# Patient Record
Sex: Female | Born: 1960 | Race: Black or African American | Marital: Married | Smoking: Never smoker
Health system: Southern US, Community
[De-identification: ages and names within clinical notes are randomized; demographics above are authoritative.]

## PROBLEM LIST (undated history)

## (undated) DIAGNOSIS — B019 Varicella without complication: Secondary | ICD-10-CM

## (undated) DIAGNOSIS — R32 Unspecified urinary incontinence: Secondary | ICD-10-CM

## (undated) HISTORY — DX: Unspecified urinary incontinence: R32

## (undated) HISTORY — DX: Varicella without complication: B01.9

---

## 2004-08-11 ENCOUNTER — Ambulatory Visit: Payer: Self-pay | Admitting: General Practice

## 2011-02-01 ENCOUNTER — Ambulatory Visit: Payer: Self-pay | Admitting: Internal Medicine

## 2011-04-15 ENCOUNTER — Ambulatory Visit: Payer: Self-pay | Admitting: Emergency Medicine

## 2011-04-15 LAB — PREGNANCY, URINE: Pregnancy Test, Urine: NEGATIVE m[IU]/mL

## 2011-04-19 LAB — PATHOLOGY REPORT

## 2012-02-29 HISTORY — PX: BREAST BIOPSY: SHX20

## 2012-06-12 ENCOUNTER — Ambulatory Visit: Payer: Self-pay | Admitting: Family Medicine

## 2013-01-29 IMAGING — US ULTRASOUND RIGHT BREAST
1 series · 15 of 15 positions shown · non-contrast
Comparison: none

REASON FOR EXAM: RT NIPPLE DISCHARGE
COMMENTS:

[Series 1: ultrasound right breast · 15 of 15 slices shown]
[im 1/15]
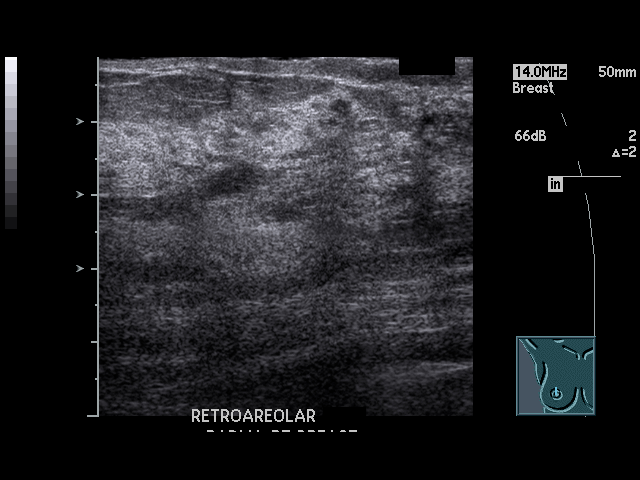
[im 2/15]
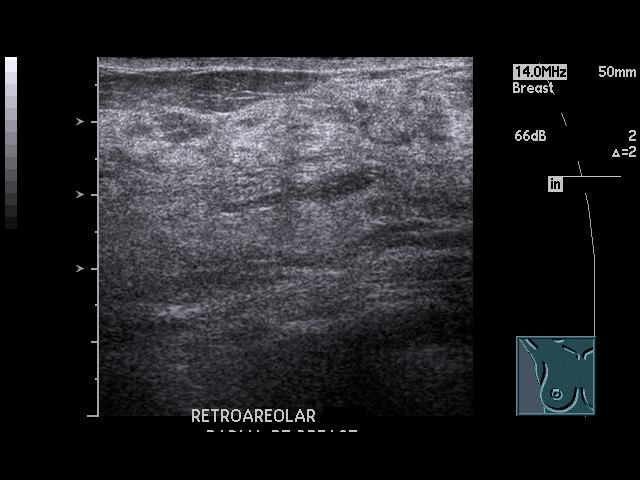
[im 3/15]
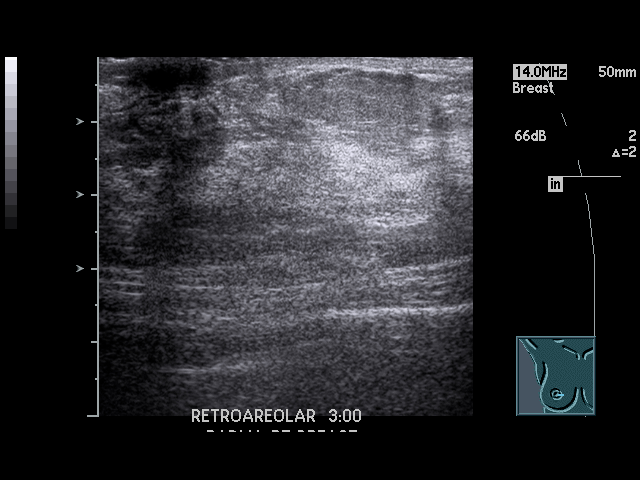
[im 4/15]
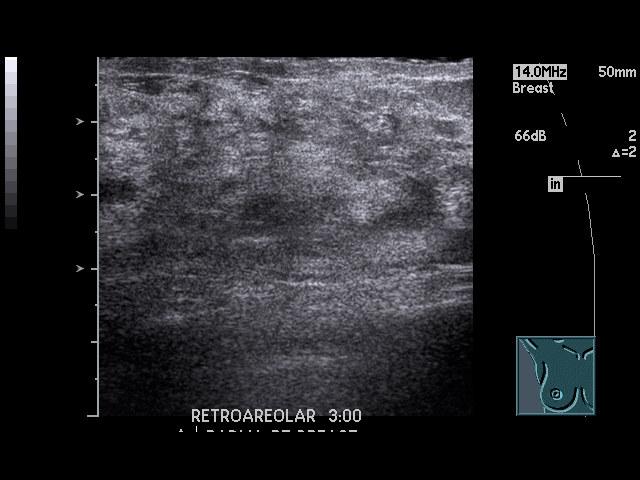
[im 5/15]
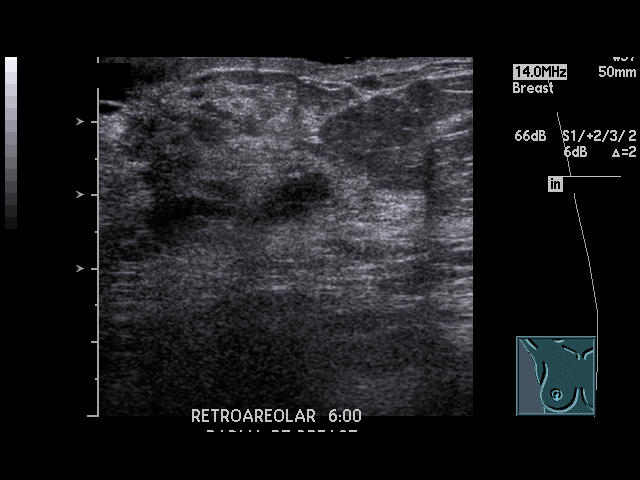
[im 6/15]
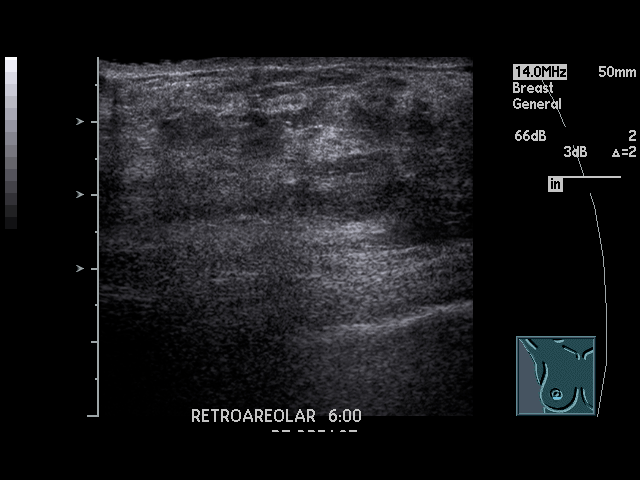
[im 7/15]
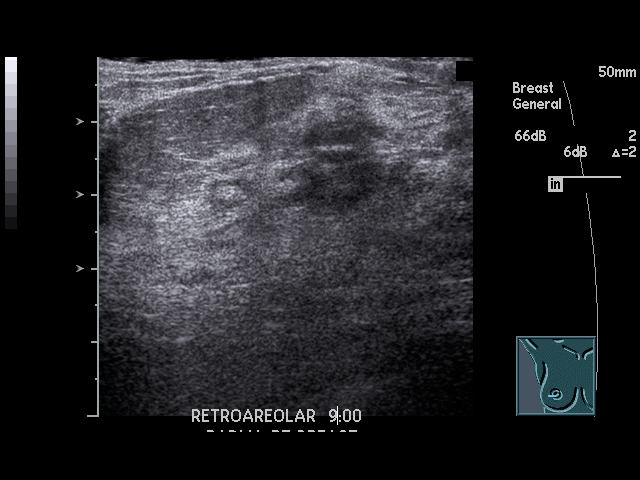
[im 8/15]
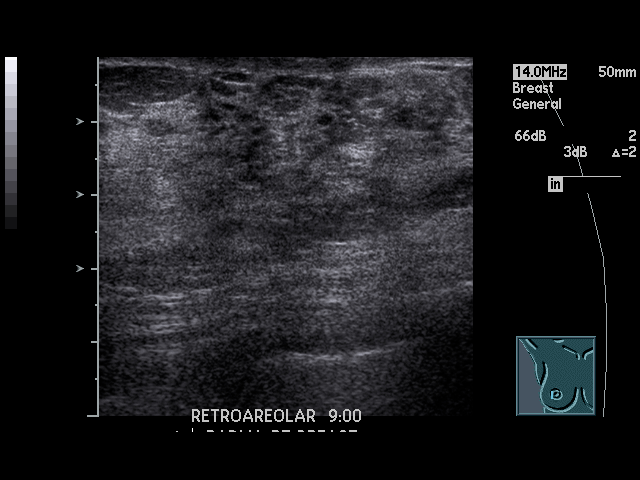
[im 9/15]
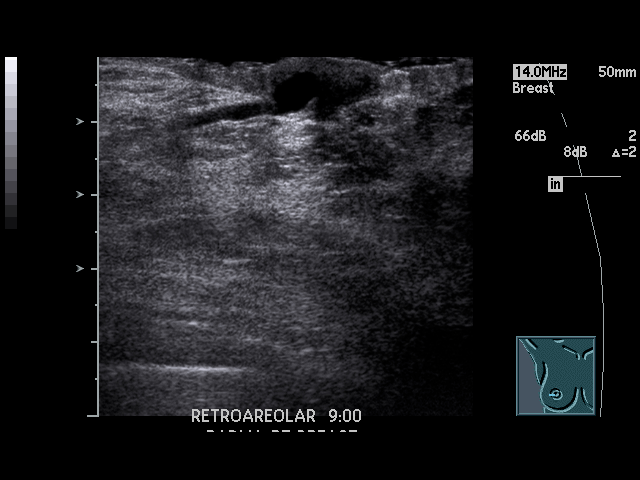
[im 10/15]
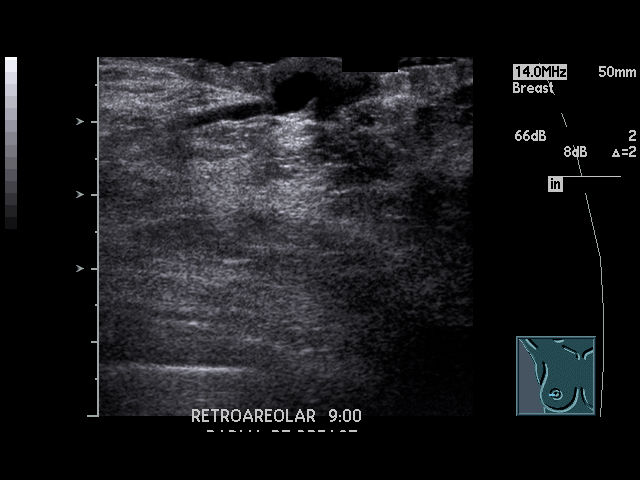
[im 11/15]
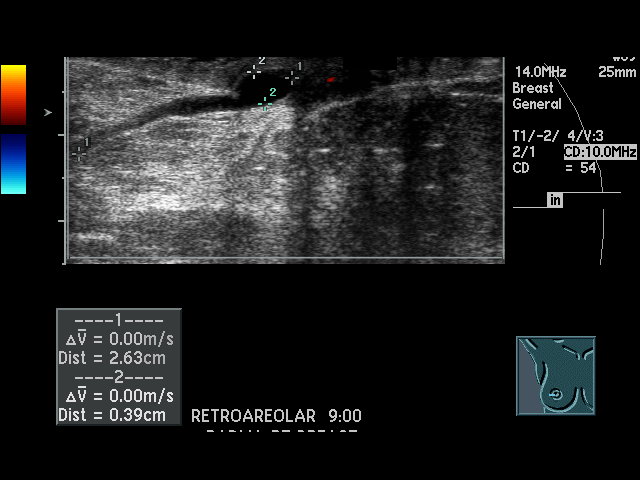
[im 12/15]
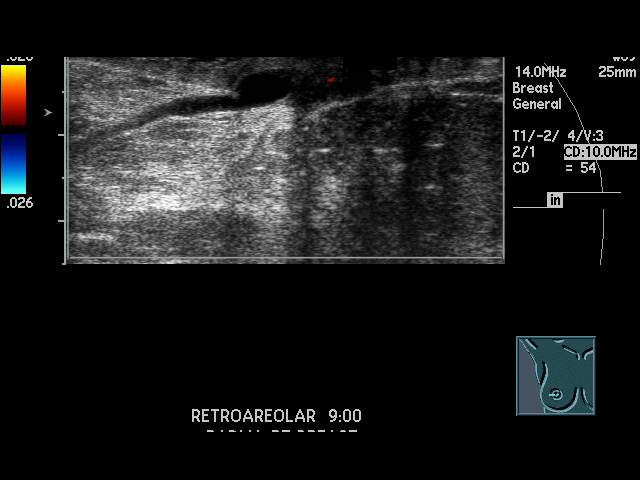
[im 13/15]
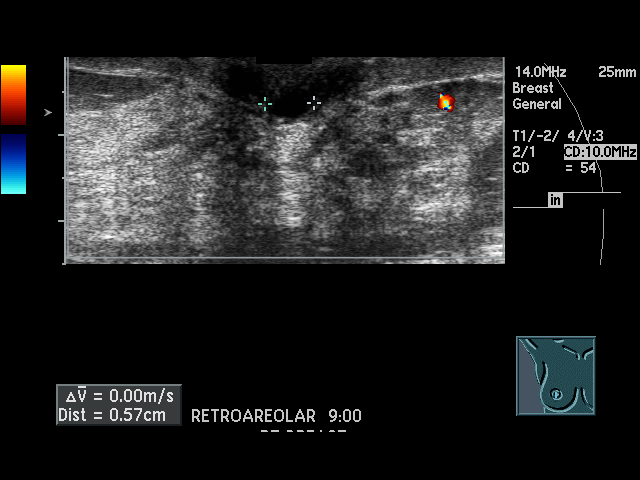
[im 14/15]
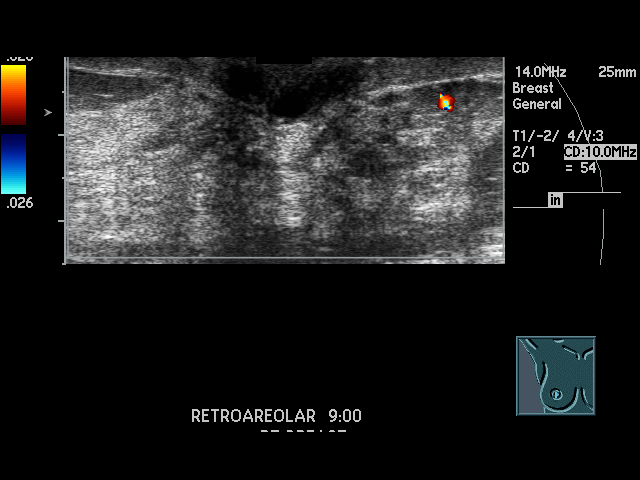
[im 15/15]
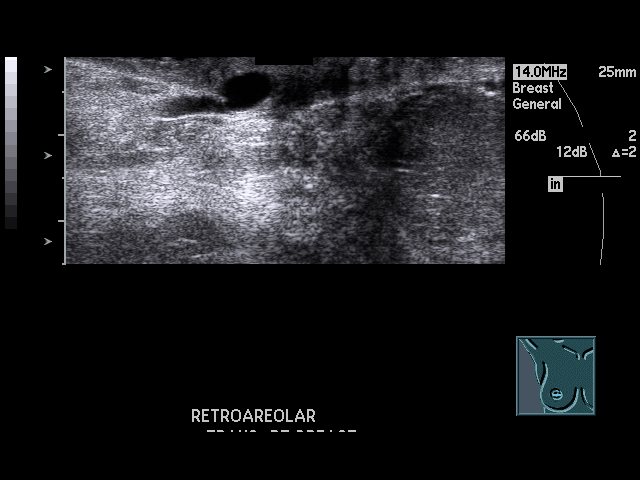

[15 of 15 positions shown; findings below may reference images not displayed]

PROCEDURE:     US  - US BREAST RIGHT  - February 01, 2011  [DATE]

RESULT:     The patient is reporting nipple discharge.

The retroareolar region of the right breast was evaluated. At the 9 o'clock
position, there is a dilated duct without intraluminal echoes. This duct
measures 2.6 x 0.4 x 0.6 cm. Otherwise, the periareolar breast parenchyma
appears normal.
IMPRESSION: There is a dilated duct at the 9 o'clock position on the
right. No intraluminal echoes are demonstrated. Please see the dictation of
the diagnostic mammogram of this same day for final recommendations and
BI-RADS classification.

## 2013-07-08 ENCOUNTER — Ambulatory Visit: Payer: Self-pay | Admitting: Family Medicine

## 2014-06-18 ENCOUNTER — Ambulatory Visit
Admit: 2014-06-18 | Disposition: A | Discharge status: Home / Self Care | Payer: Self-pay | Attending: Family Medicine | Admitting: Family Medicine

## 2014-06-22 NOTE — Op Note (Signed)
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  OPERATION: Right breast biopsy with removal of ductal system from underneath nipple with a lot of dilated ducts.   SURGEON: Brydan Downard S. Vinie Charity, MD    INDICATION FOR SURGERY: This patient was seen by me in the office because of a lot of drainage from the nipple. She says once in a while it gets bloody also. She had an ultrasound performed that a lot of dilated ducts but one was seen at about the 9 o'clock position. I talked to her that we would remove these ducts and will need to make sure there is no ductal papilloma. I told her this is a problem which people keep on having drainage even after removal and I also told her the complications of the surgery, namely nipple retraction, bleeding, and nipple atrophy due to blood supply and loss of sensation. She understood very well and she is so sick of this drain that she wants to have this taken care of.   DESCRIPTION OF PROCEDURE: The patient was then brought to surgery. Under general anesthesia, the right breast was then prepped and draped. I squeezed the nipple on the right side at the 9 o'clock position and a lot of fluid came out. An incision was made over the areola and elliptical incision was made after making a skin flap. I dissected the nipple off the ductal system. The patient was found to have multiple ducts coming into this and when we transected them a lot of milky drainage was present. The nipple was completely taken off from all the ducts. There were multiple ducts. All of those were then grasped with Allis clamps and dissection was done all around. Part of the breast tissue was then removed with these ducts and then under the nipple there  were a few areas which were still oozing. I just fulgurated them and nipple circulation looked very good. After that we stopped the bleeding from the breast side and irrigated out real good and closed it so it didn't look deformed. The nipple was then put back on the breast tissue and then subcuticular suturing was then performed and glue was applied on the skin.   The patient tolerated the procedure well and was sent to the recovery room in satisfactory condition.   ____________________________ Alton RevereMasud S. Cecelia ByarsHashmi, MD msh:drc D: 04/15/2011 09:13:18 ET T: 04/15/2011 11:12:45 ET JOB#: [id number]  cc: Cortney Mckinney S. Cecelia ByarsHashmi, MD, <Dictator> Meryle ReadyMASUD S Lemmie Vanlanen MD ELECTRONICALLY SIGNED 04/19/2011 13:13

## 2015-11-12 ENCOUNTER — Ambulatory Visit: Payer: Self-pay | Admitting: Family Medicine

## 2015-12-10 ENCOUNTER — Ambulatory Visit (INDEPENDENT_AMBULATORY_CARE_PROVIDER_SITE_OTHER): Payer: BLUE CROSS/BLUE SHIELD | Admitting: Family Medicine

## 2015-12-10 ENCOUNTER — Encounter: Payer: Self-pay | Admitting: Family Medicine

## 2015-12-10 DIAGNOSIS — Z0000 Encounter for general adult medical examination without abnormal findings: Secondary | ICD-10-CM | POA: Diagnosis not present

## 2015-12-10 NOTE — Assessment & Plan Note (Signed)
In need of Pap smear. Will return for this. Mammogram up-to-date. She will bring her results. Colonoscopy up-to-date. Defers HIV and hepatitis screening at this time. Flu shot up-to-date. Unsure of tetanus.

## 2015-12-10 NOTE — Progress Notes (Signed)
Subjective:  Patient ID: Kristen Sims, female    DOB: June 07, 1960  Age: 55 y.o. MRN: [id number]  CC: Establish care  HPI Kristen Sims is a 55 y.o. female presents to the clinic today to establish care. In need of physical exam.  Preventative Healthcare  Pap smear: In need of. Last pap was in 2010.  Mammogram: Reports she had this via work in April or May.   Colonoscopy: 5 years ago. Up to date.   Immunizations  Tetanus - Unsure.  Pneumococcal - N/A.  Flu - Up to date. Had 9/25.  Hepatitis C screening - Will wait.  Labs: Has had labs through work.   Exercise: No regular exercise.    Alcohol use: See below.  Smoking/tobacco use: Nonsmoker.  STD/HIV testing: Declines this time.  PMH, Surgical Hx, Family Hx, Social History reviewed and updated as below.  Past Medical History:  Diagnosis Date  . Chicken pox   . Urinary incontinence    Past Surgical History:  Procedure Laterality Date  . BREAST BIOPSY  2014  . CESAREAN SECTION Bilateral 1982   Family History  Problem Relation Age of Onset  . Diabetes Mother   . Lung cancer Father   . HIV Brother    Social History  Substance Use Topics  . Smoking status: Never Smoker  . Smokeless tobacco: Never Used  . Alcohol use 0.6 oz/week    1 Glasses of wine per week   Review of Systems  Cardiovascular: Positive for leg swelling.  Genitourinary:       Urinary incontinence. Sexual difficulty.  Musculoskeletal: Positive for arthralgias.  All other systems reviewed and are negative.   Objective:   Today's Vitals: BP 118/78 (BP Location: Left Arm, Patient Position: Sitting, Cuff Size: Normal)   Pulse 76   Temp 98.4 F (36.9 C) (Oral)   Resp 18   Ht 5\' 2"  (1.575 m)   Wt 233 lb 12 oz (106 kg)   LMP 02/28/2014 (Exact Date)   SpO2 96%   BMI 42.75 kg/m   Physical Exam  Constitutional: She is oriented to person, place, and time. She appears well-developed and well-nourished. No distress.  HENT:  Head:  Normocephalic and atraumatic.  Nose: Nose normal.  Mouth/Throat: Oropharynx is clear and moist. No oropharyngeal exudate.  Normal TM's bilaterally.   Eyes: Conjunctivae are normal. No scleral icterus.  Neck: Neck supple. No thyromegaly present.  Cardiovascular: Normal rate and regular rhythm.   No murmur heard. Pulmonary/Chest: Effort normal and breath sounds normal. She has no wheezes. She has no rales.  Abdominal: Soft. She exhibits no distension. There is no tenderness. There is no rebound and no guarding.  Musculoskeletal: Normal range of motion. She exhibits no edema.  Lymphadenopathy:    She has no cervical adenopathy.  Neurological: She is alert and oriented to person, place, and time.  Skin: Skin is warm and dry. No rash noted.  Psychiatric: She has a normal mood and affect.  Vitals reviewed.  Assessment & Plan:   Problem List Items Addressed This Visit    Annual physical exam    In need of Pap smear. Will return for this. Mammogram up-to-date. She will bring her results. Colonoscopy up-to-date. Defers HIV and hepatitis screening at this time. Flu shot up-to-date. Unsure of tetanus.       Other Visit Diagnoses   None.     Outpatient Encounter Prescriptions as of 12/10/2015  Medication Sig  . etodolac (LODINE) 500 MG tablet Take  500 mg by mouth 2 (two) times daily.  Marland Kitchen. oxybutynin (DITROPAN-XL) 10 MG 24 hr tablet Take 10 mg by mouth at bedtime.   No facility-administered encounter medications on file as of 12/10/2015.     Follow-up: Follow up at convenience for pap smear  Everlene OtherJayce Peace Noyes DO [facility] [place]

## 2015-12-10 NOTE — Patient Instructions (Signed)
Get your mammogram and blood work results for me.  Return at your convenience for a pap smear.  Follow up annually or sooner if needed.  Take care  Dr. Lacinda Axon   Health Maintenance, Female Adopting a healthy lifestyle and getting preventive care can go a long way to promote health and wellness. Talk with your health care provider about what schedule of regular examinations is right for you. This is a good chance for you to check in with your provider about disease prevention and staying healthy. In between checkups, there are plenty of things you can do on your own. Experts have done a lot of research about which lifestyle changes and preventive measures are most likely to keep you healthy. Ask your health care provider for more information. WEIGHT AND DIET  Eat a healthy diet  Be sure to include plenty of vegetables, fruits, low-fat dairy products, and lean protein.  Do not eat a lot of foods high in solid fats, added sugars, or salt.  Get regular exercise. This is one of the most important things you can do for your health.  Most adults should exercise for at least 150 minutes each week. The exercise should increase your heart rate and make you sweat (moderate-intensity exercise).  Most adults should also do strengthening exercises at least twice a week. This is in addition to the moderate-intensity exercise.  Maintain a healthy weight  Body mass index (BMI) is a measurement that can be used to identify possible weight problems. It estimates body fat based on height and weight. Your health care provider can help determine your BMI and help you achieve or maintain a healthy weight.  For females 48 years of age and older:   A BMI below 18.5 is considered underweight.  A BMI of 18.5 to 24.9 is normal.  A BMI of 25 to 29.9 is considered overweight.  A BMI of 30 and above is considered obese.  Watch levels of cholesterol and blood lipids  You should start having your blood tested  for lipids and cholesterol at 55 years of age, then have this test every 5 years.  You may need to have your cholesterol levels checked more often if:  Your lipid or cholesterol levels are high.  You are older than 55 years of age.  You are at high risk for heart disease.  CANCER SCREENING   Lung Cancer  Lung cancer screening is recommended for adults 38-21 years old who are at high risk for lung cancer because of a history of smoking.  A yearly low-dose CT scan of the lungs is recommended for people who:  Currently smoke.  Have quit within the past 15 years.  Have at least a 30-pack-year history of smoking. A pack year is smoking an average of one pack of cigarettes a day for 1 year.  Yearly screening should continue until it has been 15 years since you quit.  Yearly screening should stop if you develop a health problem that would prevent you from having lung cancer treatment.  Breast Cancer  Practice breast self-awareness. This means understanding how your breasts normally appear and feel.  It also means doing regular breast self-exams. Let your health care provider know about any changes, no matter how small.  If you are in your 20s or 30s, you should have a clinical breast exam (CBE) by a health care provider every 1-3 years as part of a regular health exam.  If you are 3 or older, have a  CBE every year. Also consider having a breast X-ray (mammogram) every year.  If you have a family history of breast cancer, talk to your health care provider about genetic screening.  If you are at high risk for breast cancer, talk to your health care provider about having an MRI and a mammogram every year.  Breast cancer gene (BRCA) assessment is recommended for women who have family members with BRCA-related cancers. BRCA-related cancers include:  Breast.  Ovarian.  Tubal.  Peritoneal cancers.  Results of the assessment will determine the need for genetic counseling and  BRCA1 and BRCA2 testing. Cervical Cancer Your health care provider may recommend that you be screened regularly for cancer of the pelvic organs (ovaries, uterus, and vagina). This screening involves a pelvic examination, including checking for microscopic changes to the surface of your cervix (Pap test). You may be encouraged to have this screening done every 3 years, beginning at age 74.  For women ages 51-65, health care providers may recommend pelvic exams and Pap testing every 3 years, or they may recommend the Pap and pelvic exam, combined with testing for human papilloma virus (HPV), every 5 years. Some types of HPV increase your risk of cervical cancer. Testing for HPV may also be done on women of any age with unclear Pap test results.  Other health care providers may not recommend any screening for nonpregnant women who are considered low risk for pelvic cancer and who do not have symptoms. Ask your health care provider if a screening pelvic exam is right for you.  If you have had past treatment for cervical cancer or a condition that could lead to cancer, you need Pap tests and screening for cancer for at least 20 years after your treatment. If Pap tests have been discontinued, your risk factors (such as having a new sexual partner) need to be reassessed to determine if screening should resume. Some women have medical problems that increase the chance of getting cervical cancer. In these cases, your health care provider may recommend more frequent screening and Pap tests. Colorectal Cancer  This type of cancer can be detected and often prevented.  Routine colorectal cancer screening usually begins at 55 years of age and continues through 55 years of age.  Your health care provider may recommend screening at an earlier age if you have risk factors for colon cancer.  Your health care provider may also recommend using home test kits to check for hidden blood in the stool.  A small camera at  the end of a tube can be used to examine your colon directly (sigmoidoscopy or colonoscopy). This is done to check for the earliest forms of colorectal cancer.  Routine screening usually begins at age 40.  Direct examination of the colon should be repeated every 5-10 years through 55 years of age. However, you may need to be screened more often if early forms of precancerous polyps or small growths are found. Skin Cancer  Check your skin from head to toe regularly.  Tell your health care provider about any new moles or changes in moles, especially if there is a change in a mole's shape or color.  Also tell your health care provider if you have a mole that is larger than the size of a pencil eraser.  Always use sunscreen. Apply sunscreen liberally and repeatedly throughout the day.  Protect yourself by wearing long sleeves, pants, a wide-brimmed hat, and sunglasses whenever you are outside. HEART DISEASE, DIABETES, AND HIGH BLOOD  PRESSURE   High blood pressure causes heart disease and increases the risk of stroke. High blood pressure is more likely to develop in:  People who have blood pressure in the high end of the normal range (130-139/85-89 mm Hg).  People who are overweight or obese.  People who are African American.  If you are 18-39 years of age, have your blood pressure checked every 3-5 years. If you are 40 years of age or older, have your blood pressure checked every year. You should have your blood pressure measured twice--once when you are at a hospital or clinic, and once when you are not at a hospital or clinic. Record the average of the two measurements. To check your blood pressure when you are not at a hospital or clinic, you can use:  An automated blood pressure machine at a pharmacy.  A home blood pressure monitor.  If you are between 55 years and 79 years old, ask your health care provider if you should take aspirin to prevent strokes.  Have regular diabetes  screenings. This involves taking a blood sample to check your fasting blood sugar level.  If you are at a normal weight and have a low risk for diabetes, have this test once every three years after 55 years of age.  If you are overweight and have a high risk for diabetes, consider being tested at a younger age or more often. PREVENTING INFECTION  Hepatitis B  If you have a higher risk for hepatitis B, you should be screened for this virus. You are considered at high risk for hepatitis B if:  You were born in a country where hepatitis B is common. Ask your health care provider which countries are considered high risk.  Your parents were born in a high-risk country, and you have not been immunized against hepatitis B (hepatitis B vaccine).  You have HIV or AIDS.  You use needles to inject street drugs.  You live with someone who has hepatitis B.  You have had sex with someone who has hepatitis B.  You get hemodialysis treatment.  You take certain medicines for conditions, including cancer, organ transplantation, and autoimmune conditions. Hepatitis C  Blood testing is recommended for:  Everyone born from 1945 through 1965.  Anyone with known risk factors for hepatitis C. Sexually transmitted infections (STIs)  You should be screened for sexually transmitted infections (STIs) including gonorrhea and chlamydia if:  You are sexually active and are younger than 55 years of age.  You are older than 55 years of age and your health care provider tells you that you are at risk for this type of infection.  Your sexual activity has changed since you were last screened and you are at an increased risk for chlamydia or gonorrhea. Ask your health care provider if you are at risk.  If you do not have HIV, but are at risk, it may be recommended that you take a prescription medicine daily to prevent HIV infection. This is called pre-exposure prophylaxis (PrEP). You are considered at risk  if:  You are sexually active and do not regularly use condoms or know the HIV status of your partner(s).  You take drugs by injection.  You are sexually active with a partner who has HIV. Talk with your health care provider about whether you are at high risk of being infected with HIV. If you choose to begin PrEP, you should first be tested for HIV. You should then be tested every 3   months for as long as you are taking PrEP.  PREGNANCY   If you are premenopausal and you may become pregnant, ask your health care provider about preconception counseling.  If you may become pregnant, take 400 to 800 micrograms (mcg) of folic acid every day.  If you want to prevent pregnancy, talk to your health care provider about birth control (contraception). OSTEOPOROSIS AND MENOPAUSE   Osteoporosis is a disease in which the bones lose minerals and strength with aging. This can result in serious bone fractures. Your risk for osteoporosis can be identified using a bone density scan.  If you are 47 years of age or older, or if you are at risk for osteoporosis and fractures, ask your health care provider if you should be screened.  Ask your health care provider whether you should take a calcium or vitamin D supplement to lower your risk for osteoporosis.  Menopause may have certain physical symptoms and risks.  Hormone replacement therapy may reduce some of these symptoms and risks. Talk to your health care provider about whether hormone replacement therapy is right for you.  HOME CARE INSTRUCTIONS   Schedule regular health, dental, and eye exams.  Stay current with your immunizations.   Do not use any tobacco products including cigarettes, chewing tobacco, or electronic cigarettes.  If you are pregnant, do not drink alcohol.  If you are breastfeeding, limit how much and how often you drink alcohol.  Limit alcohol intake to no more than 1 drink per day for nonpregnant women. One drink equals 12  ounces of beer, 5 ounces of wine, or 1 ounces of hard liquor.  Do not use street drugs.  Do not share needles.  Ask your health care provider for help if you need support or information about quitting drugs.  Tell your health care provider if you often feel depressed.  Tell your health care provider if you have ever been abused or do not feel safe at home.   This information is not intended to replace advice given to you by your health care provider. Make sure you discuss any questions you have with your health care provider.   Document Released: 08/30/2010 Document Revised: 03/07/2014 Document Reviewed: 01/16/2013 Elsevier Interactive Patient Education [employer].

## 2015-12-23 ENCOUNTER — Encounter: Payer: BLUE CROSS/BLUE SHIELD | Admitting: Family Medicine

## 2018-09-19 DIAGNOSIS — Z1231 Encounter for screening mammogram for malignant neoplasm of breast: Secondary | ICD-10-CM | POA: Diagnosis not present

## 2018-11-01 DIAGNOSIS — I119 Hypertensive heart disease without heart failure: Secondary | ICD-10-CM | POA: Diagnosis not present

## 2018-11-01 DIAGNOSIS — M25569 Pain in unspecified knee: Secondary | ICD-10-CM | POA: Diagnosis not present

## 2018-11-01 DIAGNOSIS — E669 Obesity, unspecified: Secondary | ICD-10-CM | POA: Diagnosis not present

## 2018-11-01 DIAGNOSIS — R32 Unspecified urinary incontinence: Secondary | ICD-10-CM | POA: Diagnosis not present

## 2018-11-06 ENCOUNTER — Other Ambulatory Visit: Payer: Self-pay

## 2018-11-07 LAB — BASIC METABOLIC PANEL
BUN/Creatinine Ratio: 17 (ref 9–23)
BUN: 15 mg/dL (ref 6–24)
CO2: 23 mmol/L (ref 20–29)
Calcium: 9.4 mg/dL (ref 8.7–10.2)
Chloride: 101 mmol/L (ref 96–106)
Creatinine, Ser: 0.9 mg/dL (ref 0.57–1.00)
GFR calc Af Amer: 82 mL/min/{1.73_m2} (ref >59)
GFR calc non Af Amer: 71 mL/min/{1.73_m2} (ref >59)
Glucose: 110 mg/dL — ABNORMAL HIGH (ref 65–99)
Potassium: 3.8 mmol/L (ref 3.5–5.2)
Sodium: 139 mmol/L (ref 134–144)

## 2018-11-07 LAB — CBC WITH DIFFERENTIAL/PLATELET
Basophils Absolute: 0 10*3/uL (ref 0.0–0.2)
Basos: 1 %
EOS (ABSOLUTE): 0.2 10*3/uL (ref 0.0–0.4)
Eos: 2 %
Hematocrit: 38.4 % (ref 34.0–46.6)
Hemoglobin: 12.3 g/dL (ref 11.1–15.9)
Immature Grans (Abs): 0 10*3/uL (ref 0.0–0.1)
Immature Granulocytes: 0 %
Lymphocytes Absolute: 2 10*3/uL (ref 0.7–3.1)
Lymphs: 32 %
MCH: 26.4 pg — ABNORMAL LOW (ref 26.6–33.0)
MCHC: 32 g/dL (ref 31.5–35.7)
MCV: 82 fL (ref 79–97)
Monocytes Absolute: 0.5 10*3/uL (ref 0.1–0.9)
Monocytes: 7 %
Neutrophils Absolute: 3.6 10*3/uL (ref 1.4–7.0)
Neutrophils: 58 %
Platelets: 235 10*3/uL (ref 150–450)
RBC: 4.66 x10E6/uL (ref 3.77–5.28)
RDW: 13.1 % (ref 11.7–15.4)
WBC: 6.2 10*3/uL (ref 3.4–10.8)

## 2018-11-07 LAB — LIPID PANEL W/O CHOL/HDL RATIO
Cholesterol, Total: 217 mg/dL — ABNORMAL HIGH (ref 100–199)
HDL: 66 mg/dL (ref >39)
LDL Chol Calc (NIH): 140 mg/dL — ABNORMAL HIGH (ref 0–99)
Triglycerides: 65 mg/dL (ref 0–149)
VLDL Cholesterol Cal: 11 mg/dL (ref 5–40)

## 2018-11-07 LAB — URINALYSIS, ROUTINE W REFLEX MICROSCOPIC
Bilirubin, UA: NEGATIVE
Glucose, UA: NEGATIVE
Ketones, UA: NEGATIVE
Leukocytes,UA: NEGATIVE
Nitrite, UA: NEGATIVE
Protein,UA: NEGATIVE
RBC, UA: NEGATIVE
Specific Gravity, UA: 1.01 (ref 1.005–1.030)
Urobilinogen, Ur: 1 mg/dL (ref 0.2–1.0)
pH, UA: 6 (ref 5.0–7.5)

## 2018-11-07 LAB — TSH: TSH: 1.74 u[IU]/mL (ref 0.450–4.500)

## 2018-11-08 DIAGNOSIS — E669 Obesity, unspecified: Secondary | ICD-10-CM | POA: Diagnosis not present

## 2018-11-08 DIAGNOSIS — E876 Hypokalemia: Secondary | ICD-10-CM | POA: Diagnosis not present

## 2018-11-08 DIAGNOSIS — I119 Hypertensive heart disease without heart failure: Secondary | ICD-10-CM | POA: Diagnosis not present

## 2018-11-08 DIAGNOSIS — R32 Unspecified urinary incontinence: Secondary | ICD-10-CM | POA: Diagnosis not present

## 2019-02-08 DIAGNOSIS — I119 Hypertensive heart disease without heart failure: Secondary | ICD-10-CM | POA: Diagnosis not present

## 2019-02-08 DIAGNOSIS — E669 Obesity, unspecified: Secondary | ICD-10-CM | POA: Diagnosis not present

## 2019-02-08 DIAGNOSIS — R32 Unspecified urinary incontinence: Secondary | ICD-10-CM | POA: Diagnosis not present

## 2019-02-08 DIAGNOSIS — E876 Hypokalemia: Secondary | ICD-10-CM | POA: Diagnosis not present

## 2019-08-20 ENCOUNTER — Other Ambulatory Visit: Payer: Self-pay | Admitting: *Deleted

## 2019-09-18 DIAGNOSIS — Z1231 Encounter for screening mammogram for malignant neoplasm of breast: Secondary | ICD-10-CM | POA: Diagnosis not present

## 2019-12-26 ENCOUNTER — Other Ambulatory Visit: Payer: Self-pay | Admitting: Internal Medicine

## 2020-07-13 ENCOUNTER — Other Ambulatory Visit: Payer: Self-pay | Admitting: Internal Medicine

## 2020-11-03 ENCOUNTER — Other Ambulatory Visit: Payer: Self-pay | Admitting: Internal Medicine

## 2021-05-09 ENCOUNTER — Other Ambulatory Visit: Payer: Self-pay | Admitting: Internal Medicine

## 2021-11-24 ENCOUNTER — Other Ambulatory Visit: Payer: Self-pay | Admitting: Internal Medicine

## 2024-03-05 ENCOUNTER — Other Ambulatory Visit: Payer: Self-pay

## 2024-03-05 ENCOUNTER — Emergency Department
Admission: EM | Admit: 2024-03-05 | Discharge: 2024-03-06 | Disposition: A | Discharge status: Home / Self Care | Payer: Self-pay | Attending: Emergency Medicine | Admitting: Emergency Medicine

## 2024-03-05 DIAGNOSIS — G5701 Lesion of sciatic nerve, right lower limb: Secondary | ICD-10-CM

## 2024-03-05 DIAGNOSIS — Z79899 Other long term (current) drug therapy: Secondary | ICD-10-CM | POA: Diagnosis not present

## 2024-03-05 DIAGNOSIS — M5416 Radiculopathy, lumbar region: Secondary | ICD-10-CM | POA: Insufficient documentation

## 2024-03-05 DIAGNOSIS — M25551 Pain in right hip: Secondary | ICD-10-CM | POA: Diagnosis present

## 2024-03-05 DIAGNOSIS — I10 Essential (primary) hypertension: Secondary | ICD-10-CM | POA: Diagnosis not present

## 2024-03-05 NOTE — ED Provider Notes (Signed)
 "  [facility] Provider Note    Event Date/Time   First MD Initiated Contact with Patient 03/05/24 2323     (approximate)   History   Back Pain (R)   HPI  Kristen Sims is a 64 y.o. female with history of hypertension, obesity who presents to the emergency department with pain in the right posterior hip and buttock that radiates down into the thigh.  Pain now also goes into the right low back.   Right low back pain into thigh No injury No numbness, tingling, weakness No incontinence or urinary retention No fever No back or hip surgery Able to walk No DVT No calf pain or swelling  History provided by patient and family.    Past Medical History:  Diagnosis Date   Chicken pox    Urinary incontinence     Past Surgical History:  Procedure Laterality Date   BREAST BIOPSY  2014   CESAREAN SECTION Bilateral 1982    MEDICATIONS:  Prior to Admission medications  Medication Sig Start Date End Date Taking? Authorizing Provider  etodolac (LODINE) 500 MG tablet Take 500 mg by mouth 2 (two) times daily.    [provider]  losartan-hydrochlorothiazide (HYZAAR) 100-12.5 MG tablet TAKE 1 TABLET BY MOUTH EVERY DAY 12/27/19   Masoud, Javed, MD  oxybutynin (DITROPAN) 5 MG tablet TAKE 1 TABLET BY MOUTH TWICE A DAY 11/24/21   Masoud, Javed, MD  oxybutynin (DITROPAN-XL) 10 MG 24 hr tablet Take 10 mg by mouth at bedtime.    [provider]    Physical Exam   Triage Vital Signs: ED Triage Vitals  Encounter Vitals Group     BP 03/05/24 2146 (!) 162/98     Girls Systolic BP Percentile --      Girls Diastolic BP Percentile --      Boys Systolic BP Percentile --      Boys Diastolic BP Percentile --      Pulse Rate 03/05/24 2146 91     Resp 03/05/24 2146 16     Temp 03/05/24 2146 98.2 F (36.8 C)     Temp Source 03/05/24 2146 Oral     SpO2 03/05/24 2146 99 %     Weight --      Height --      Head Circumference --      Peak Flow --       Pain Score 03/05/24 2144 7     Pain Loc --      Pain Education --      Exclude from Growth Chart --     Most recent vital signs: Vitals:   03/05/24 2146 03/05/24 2346  BP: (!) 162/98 (!) 156/78  Pulse: 91 87  Resp: 16 14  Temp: 98.2 F (36.8 C) 98.1 F (36.7 C)  SpO2: 99% 98%     CONSTITUTIONAL: Alert and responds appropriately to questions. Well-appearing; well-nourished HEAD: Normocephalic, atraumatic EYES: Conjunctivae clear, pupils appear equal ENT: normal nose; moist mucous membranes NECK: Normal range of motion CARD: Regular rate and rhythm RESP: Normal chest excursion without splinting or tachypnea; no hypoxia or respiratory distress, speaking full sentences ABD/GI: non-distended BACK: No midline spinal tenderness or step-off or deformity EXT: Normal ROM in all joints, no major deformities noted, no calf tenderness or calf swelling, compartments soft, extremity warm well-perfused, no bony tenderness or bony deformity, tender over the gluteus area  SKIN: Normal color for age and race, no rashes on exposed skin NEURO: Moves  all extremities equally, normal speech, no facial asymmetry noted, reports normal sensation diffusely, no saddle anesthesia, no hyper reflexia PSYCH: The patient's mood and manner are appropriate. Grooming and personal hygiene are appropriate.  ED Results / Procedures / Treatments   LABS: (all labs ordered are listed, but only abnormal results are displayed) Labs Reviewed - No data to display   EKG:  EKG Interpretation Date/Time:    Ventricular Rate:    PR Interval:    QRS Duration:    QT Interval:    QTC Calculation:   R Axis:      Text Interpretation:            RADIOLOGY: My personal review and interpretation of imaging:    I have personally reviewed all radiology reports. No results found.   PROCEDURES:  Critical Care performed: No   CRITICAL CARE Performed by: Josette Sink   Total critical care time: 0  minutes  Critical care time was exclusive of separately billable procedures and treating other patients.  Critical care was necessary to treat or prevent imminent or life-threatening deterioration.  Critical care was time spent personally by me on the following activities: development of treatment plan with patient and/or surrogate as well as nursing, discussions with consultants, evaluation of patient's response to treatment, examination of patient, obtaining history from patient or surrogate, ordering and performing treatments and interventions, ordering and review of laboratory studies, ordering and review of radiographic studies, pulse oximetry and re-evaluation of patient's condition.   Procedures    IMPRESSION / MDM / ASSESSMENT AND PLAN / ED COURSE  I reviewed the triage vital signs and the nursing notes.   Patient here with right posterior hip and buttock pain radiating down the leg.     DIFFERENTIAL DIAGNOSIS (includes but not limited to):   Lumbosacral radiculopathy, piriformis syndrome, arthritis, labral injury, doubt bursitis, doubt septic arthritis, I am not concerned at this time for cauda equina, epidural abscess or hematoma, discitis or osteomyelitis, transverse myelitis, fracture  Patient's presentation is most consistent with acute complicated illness / injury requiring diagnostic workup.  PLAN: Patient likely with lumbosacral radiculopathy and piriformis syndrome.  Will discharge with pain medication, steroid taper.  Recommended massage, stretching, alternating heat and ice.  Will give PCP follow-up.  No red flag symptoms.  No indication for emergent imaging.   MEDICATIONS GIVEN IN ED: Medications  oxyCODONE  (Oxy IR/ROXICODONE ) immediate release tablet 10 mg (10 mg Oral Given 03/06/24 0009)  ondansetron  (ZOFRAN -ODT) disintegrating tablet 4 mg (4 mg Oral Given 03/06/24 0010)  predniSONE  (DELTASONE ) tablet 60 mg (60 mg Oral Given 03/06/24 0009)     ED COURSE:  At this  time, I do not feel there is any life-threatening condition present. I reviewed all nursing notes, vitals, pertinent previous records.  All lab and urine results, EKGs, imaging ordered have been independently reviewed and interpreted by myself.  I reviewed all available radiology reports from any imaging ordered this visit.  Based on my assessment, I feel the patient is safe to be discharged home without further emergent workup and can continue workup as an outpatient as needed. Discussed all findings, treatment plan as well as usual and customary return precautions.  They verbalize understanding and are comfortable with this plan.  Outpatient follow-up has been provided as needed.  All questions have been answered.    CONSULTS:  none   OUTSIDE RECORDS REVIEWED: Reviewed most recent internal medicine notes.     FINAL CLINICAL IMPRESSION(S) / ED DIAGNOSES  Final diagnoses:  Lumbar radiculopathy, acute  Piriformis syndrome of right side     Rx / DC Orders   ED Discharge Orders          Ordered    Ambulatory Referral to Primary Care (Establish Care)        03/06/24 0002    oxyCODONE  (ROXICODONE ) 5 MG immediate release tablet  Every 8 hours PRN        03/06/24 0026    ibuprofen  (ADVIL ) 800 MG tablet  Every 8 hours PRN        03/06/24 0026    ondansetron  (ZOFRAN -ODT) 4 MG disintegrating tablet  Every 6 hours PRN        03/06/24 0026    predniSONE  (STERAPRED UNI-PAK 21 TAB) 10 MG (21) TBPK tablet        03/06/24 0026             Note:  This document was prepared using Dragon voice recognition software and may include unintentional dictation errors.   Vertis Bauder, Josette SAILOR, DO 03/06/24 [phone]  "

## 2024-03-05 NOTE — ED Triage Notes (Signed)
 Pt reports rights sided back and right sided leg pain that started today. No known injury. No leg numbness or weakness.

## 2024-03-06 MED ORDER — ONDANSETRON 4 MG PO TBDP
4.0000 mg | ORAL_TABLET | Freq: Once | ORAL | Status: AC
  Administered 2024-03-06: 4 mg via ORAL
  Filled 2024-03-06: qty 1

## 2024-03-06 MED ORDER — OXYCODONE HCL 5 MG PO TABS
10.0000 mg | ORAL_TABLET | Freq: Once | ORAL | Status: AC
  Administered 2024-03-06: 10 mg via ORAL
  Filled 2024-03-06: qty 2

## 2024-03-06 MED ORDER — PREDNISONE 20 MG PO TABS
60.0000 mg | ORAL_TABLET | Freq: Once | ORAL | Status: AC
  Administered 2024-03-06: 60 mg via ORAL
  Filled 2024-03-06: qty 3

## 2024-03-06 MED ORDER — PREDNISONE 10 MG (21) PO TBPK: ORAL_TABLET | ORAL | 0 refills | Status: AC

## 2024-03-06 MED ORDER — OXYCODONE HCL 5 MG PO TABS: 5.0000 mg | ORAL_TABLET | Freq: Three times a day (TID) | ORAL | 0 refills | Status: AC | PRN

## 2024-03-06 MED ORDER — ONDANSETRON 4 MG PO TBDP: 4.0000 mg | ORAL_TABLET | Freq: Four times a day (QID) | ORAL | 0 refills | Status: AC | PRN

## 2024-03-06 MED ORDER — IBUPROFEN 800 MG PO TABS: 800.0000 mg | ORAL_TABLET | Freq: Three times a day (TID) | ORAL | 0 refills | Status: AC | PRN

## 2024-03-06 NOTE — Discharge Instructions (Addendum)

## 2024-03-06 NOTE — ED Notes (Signed)
 AVS provided by edp was reviewed with pt. Pt verbalized understanding with no additional questions at this time. Pharmacy verified. Pt family at bedside taking her home. Pt ambulatory at time of discharge.

## 2024-03-07 ENCOUNTER — Ambulatory Visit: Admitting: Family Medicine

## 2024-04-30 ENCOUNTER — Ambulatory Visit
